# Patient Record
Sex: Male | Born: 1970 | Race: Black or African American | Hispanic: No | Marital: Single | Smoking: Never smoker
Health system: Southern US, Community
[De-identification: ages and names within clinical notes are randomized; demographics above are authoritative.]

## PROBLEM LIST (undated history)

## (undated) DIAGNOSIS — E079 Disorder of thyroid, unspecified: Secondary | ICD-10-CM

## (undated) DIAGNOSIS — K5792 Diverticulitis of intestine, part unspecified, without perforation or abscess without bleeding: Secondary | ICD-10-CM

---

## 1998-09-11 ENCOUNTER — Emergency Department (HOSPITAL_COMMUNITY): Admission: EM | Admit: 1998-09-11 | Discharge: 1998-09-11 | Payer: Self-pay | Admitting: Emergency Medicine

## 1998-09-12 ENCOUNTER — Encounter: Payer: Self-pay | Admitting: Emergency Medicine

## 2002-06-21 ENCOUNTER — Emergency Department (HOSPITAL_COMMUNITY): Admission: EM | Admit: 2002-06-21 | Discharge: 2002-06-21 | Payer: Self-pay | Admitting: Emergency Medicine

## 2008-10-07 ENCOUNTER — Emergency Department (HOSPITAL_BASED_OUTPATIENT_CLINIC_OR_DEPARTMENT_OTHER): Admission: EM | Admit: 2008-10-07 | Discharge: 2008-10-07 | Payer: Self-pay | Admitting: Emergency Medicine

## 2009-07-13 ENCOUNTER — Emergency Department (HOSPITAL_BASED_OUTPATIENT_CLINIC_OR_DEPARTMENT_OTHER): Admission: EM | Admit: 2009-07-13 | Discharge: 2009-07-13 | Payer: Self-pay | Admitting: Emergency Medicine

## 2009-07-13 ENCOUNTER — Ambulatory Visit: Payer: Self-pay | Admitting: Interventional Radiology

## 2010-12-13 ENCOUNTER — Emergency Department (INDEPENDENT_AMBULATORY_CARE_PROVIDER_SITE_OTHER): Payer: Self-pay

## 2010-12-13 ENCOUNTER — Emergency Department (HOSPITAL_BASED_OUTPATIENT_CLINIC_OR_DEPARTMENT_OTHER)
Admission: EM | Admit: 2010-12-13 | Discharge: 2010-12-13 | Disposition: A | Discharge status: Home / Self Care | Payer: Self-pay | Attending: Emergency Medicine | Admitting: Emergency Medicine

## 2010-12-13 DIAGNOSIS — R079 Chest pain, unspecified: Secondary | ICD-10-CM | POA: Insufficient documentation

## 2010-12-13 LAB — BASIC METABOLIC PANEL
BUN: 14 mg/dL (ref 6–23)
CO2: 24 mEq/L (ref 19–32)
Calcium: 9.7 mg/dL (ref 8.4–10.5)
Chloride: 106 mEq/L (ref 96–112)
Creatinine, Ser: 1.5 mg/dL (ref 0.4–1.5)
GFR calc Af Amer: >60 mL/min (ref >60)

## 2010-12-13 LAB — DIFFERENTIAL
Basophils Absolute: 0 10*3/uL (ref 0.0–0.1)
Basophils Relative: 0 % (ref 0–1)
Eosinophils Absolute: 0.1 10*3/uL (ref 0.0–0.7)
Eosinophils Relative: 1 % (ref 0–5)
Lymphocytes Relative: 48 % — ABNORMAL HIGH (ref 12–46)
Lymphs Abs: 4 10*3/uL (ref 0.7–4.0)
Monocytes Absolute: 0.7 10*3/uL (ref 0.1–1.0)
Monocytes Relative: 8 % (ref 3–12)
Neutro Abs: 3.7 10*3/uL (ref 1.7–7.7)
Neutrophils Relative %: 43 % (ref 43–77)

## 2010-12-13 LAB — CBC
MCH: 28.9 pg (ref 26.0–34.0)
MCHC: 34.6 g/dL (ref 30.0–36.0)
MCV: 83.4 fL (ref 78.0–100.0)
Platelets: 240 10*3/uL (ref 150–400)
RBC: 5.47 MIL/uL (ref 4.22–5.81)

## 2010-12-13 LAB — POCT CARDIAC MARKERS
CKMB, poc: <1 ng/mL — ABNORMAL LOW (ref 1.0–8.0)
Troponin i, poc: <0.05 ng/mL (ref 0.00–0.09)
Troponin i, poc: <0.05 ng/mL (ref 0.00–0.09)

## 2010-12-13 LAB — LIPASE, BLOOD: Lipase: 64 U/L (ref 23–300)

## 2010-12-20 ENCOUNTER — Ambulatory Visit (HOSPITAL_COMMUNITY): Payer: Self-pay

## 2011-01-14 LAB — BASIC METABOLIC PANEL
BUN: 15 mg/dL (ref 6–23)
Creatinine, Ser: 1.3 mg/dL (ref 0.4–1.5)
GFR calc Af Amer: >60 mL/min (ref >60)
GFR calc non Af Amer: >60 mL/min (ref >60)

## 2011-12-17 ENCOUNTER — Encounter (HOSPITAL_BASED_OUTPATIENT_CLINIC_OR_DEPARTMENT_OTHER): Payer: Self-pay | Admitting: Emergency Medicine

## 2011-12-17 ENCOUNTER — Emergency Department (HOSPITAL_BASED_OUTPATIENT_CLINIC_OR_DEPARTMENT_OTHER)
Admission: EM | Admit: 2011-12-17 | Discharge: 2011-12-17 | Disposition: A | Discharge status: Home / Self Care | Payer: Self-pay | Attending: Emergency Medicine | Admitting: Emergency Medicine

## 2011-12-17 DIAGNOSIS — Z79899 Other long term (current) drug therapy: Secondary | ICD-10-CM | POA: Insufficient documentation

## 2011-12-17 DIAGNOSIS — R109 Unspecified abdominal pain: Secondary | ICD-10-CM | POA: Insufficient documentation

## 2011-12-17 DIAGNOSIS — K5732 Diverticulitis of large intestine without perforation or abscess without bleeding: Secondary | ICD-10-CM | POA: Insufficient documentation

## 2011-12-17 DIAGNOSIS — K5792 Diverticulitis of intestine, part unspecified, without perforation or abscess without bleeding: Secondary | ICD-10-CM

## 2011-12-17 HISTORY — DX: Diverticulitis of intestine, part unspecified, without perforation or abscess without bleeding: K57.92

## 2011-12-17 MED ORDER — HYDROCODONE-ACETAMINOPHEN 5-325 MG PO TABS: 1.0000 | ORAL_TABLET | Freq: Four times a day (QID) | ORAL | Status: AC | PRN

## 2011-12-17 MED ORDER — METRONIDAZOLE 500 MG PO TABS: 500.0000 mg | ORAL_TABLET | Freq: Three times a day (TID) | ORAL | Status: AC

## 2011-12-17 MED ORDER — CIPROFLOXACIN HCL 500 MG PO TABS
500.0000 mg | ORAL_TABLET | Freq: Once | ORAL | Status: AC
  Administered 2011-12-17: 500 mg via ORAL
  Filled 2011-12-17: qty 1

## 2011-12-17 MED ORDER — CIPROFLOXACIN HCL 500 MG PO TABS: 500.0000 mg | ORAL_TABLET | Freq: Two times a day (BID) | ORAL | Status: AC

## 2011-12-17 MED ORDER — METRONIDAZOLE 500 MG PO TABS
500.0000 mg | ORAL_TABLET | Freq: Once | ORAL | Status: AC
  Administered 2011-12-17: 500 mg via ORAL
  Filled 2011-12-17: qty 1

## 2011-12-17 NOTE — ED Notes (Signed)
Pt c/o pain behind knees bilaterally x 1-2 weeks. Pt is concerned about DVT. Pt also states "diverticulosis is acting up". Pt c/o abd pain with diarrhea.

## 2011-12-17 NOTE — Discharge Instructions (Signed)
Diverticulitis A diverticulum is a small pouch or sac on the colon. Diverticulosis is the presence of these diverticula on the colon. Diverticulitis is the irritation (inflammation) or infection of diverticula. CAUSES  The colon and its diverticula contain bacteria. If food particles block the tiny opening to a diverticulum, the bacteria inside can grow and cause an increase in pressure. This leads to infection and inflammation and is called diverticulitis. SYMPTOMS   Abdominal pain and tenderness. Usually, the pain is located on the left side of your abdomen. However, it could be located elsewhere.   Fever.   Bloating.   Feeling sick to your stomach (nausea).   Throwing up (vomiting).   Abnormal stools.  DIAGNOSIS  Your caregiver will take a history and perform a physical exam. Since many things can cause abdominal pain, other tests may be necessary. Tests may include:  Blood tests.   Urine tests.   X-ray of the abdomen.   CT scan of the abdomen.  Sometimes, surgery is needed to determine if diverticulitis or other conditions are causing your symptoms. TREATMENT  Most of the time, you can be treated without surgery. Treatment includes:  Resting the bowels by only having liquids for a few days. As you improve, you will need to eat a low-fiber diet.   Intravenous (IV) fluids if you are losing body fluids (dehydrated).   Antibiotic medicines that treat infections may be given.   Pain and nausea medicine, if needed.   Surgery if the inflamed diverticulum has burst.  HOME CARE INSTRUCTIONS   Try a clear liquid diet (broth, tea, or water for as long as directed by your caregiver). You may then gradually begin a low-fiber diet as tolerated. A low-fiber diet is a diet with less than 10 grams of fiber. Choose the foods below to reduce fiber in the diet:   White breads, cereals, rice, and pasta.   Cooked fruits and vegetables or soft fresh fruits and vegetables without the skin.     Ground or well-cooked tender beef, ham, veal, lamb, pork, or poultry.   Eggs and seafood.   After your diverticulitis symptoms have improved, your caregiver may put you on a high-fiber diet. A high-fiber diet includes 14 grams of fiber for every 1000 calories consumed. For a standard 2000 calorie diet, you would need 28 grams of fiber. Follow these diet guidelines to help you increase the fiber in your diet. It is important to slowly increase the amount fiber in your diet to avoid gas, constipation, and bloating.   Choose whole-grain breads, cereals, pasta, and brown rice.   Choose fresh fruits and vegetables with the skin on. Do not overcook vegetables because the more vegetables are cooked, the more fiber is lost.   Choose more nuts, seeds, legumes, dried peas, beans, and lentils.   Look for food products that have greater than 3 grams of fiber per serving on the Nutrition Facts label.   Take all medicine as directed by your caregiver.   If your caregiver has given you a follow-up appointment, it is very important that you go. Not going could result in lasting (chronic) or permanent injury, pain, and disability. If there is any problem keeping the appointment, call to reschedule.  SEEK MEDICAL CARE IF:   Your pain does not improve.   You have a hard time advancing your diet beyond clear liquids.   Your bowel movements do not return to normal.  SEEK IMMEDIATE MEDICAL CARE IF:   Your pain becomes   worse.   You have an oral temperature above 102 F (38.9 C), not controlled by medicine.   You have repeated vomiting.   You have bloody or black, tarry stools.   Symptoms that brought you to your caregiver become worse or are not getting better.  MAKE SURE YOU:   Understand these instructions.   Will watch your condition.   Will get help right away if you are not doing well or get worse.  Document Released: 06/26/2005 Document Revised: 09/05/2011 Document Reviewed:  10/22/2010 [employer] Patient Information 2012 [employer]. 

## 2011-12-17 NOTE — ED Provider Notes (Addendum)
History     CSN: [id number]  Arrival date & time 12/17/11  0516   First MD Initiated Contact with Patient 12/17/11 0530      Chief Complaint  Patient presents with  . Abdominal Pain    (Consider location/radiation/quality/duration/timing/severity/associated sxs/prior treatment) HPI Is a 41 year old white male with a history of diverticulitis. He has had 3 days of left lower quadrant pain and tenderness consistent with his prior episodes of diverticulitis. The pain is mild to moderate, worse with movement or palpation. Has been associated with diarrhea but no hematochezia or melena. Has had no nausea, vomiting, fever chills. He states his diverticulitis his first diagnosed about 4 years ago and was confirmed with a CT scan.   He has also had intermittent pains in his eye lateral popliteal fossae and antecubital fossae for the past 3 weeks. These pains are well localized. There is no tenderness associated with these pains. There is no edema associated with these pains. He denies any injury. 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. He denies any recent surgery, immobilization, prolonged car or plane trip or smoking. He asked about the possibility of DVT.  Past Medical History  Diagnosis Date  . Diverticulitis     History reviewed. No pertinent past surgical history.  No family history on file.  History  Substance Use Topics  . Smoking status: Never Smoker   . Smokeless tobacco: Not on file  . Alcohol Use: No      Review of Systems  All other systems reviewed and are negative.    Allergies  Review of patient's allergies indicates no known allergies.  Home Medications   Current Outpatient Rx  Name Route Sig Dispense Refill  . LEVOTHYROXINE SODIUM 50 MCG PO TABS Oral Take 50 mcg by mouth daily.    Marland Kitchen LOVASTATIN 20 MG PO TABS Oral Take 20 mg by mouth at bedtime.    Marland Kitchen PANTOPRAZOLE SODIUM 40 MG PO TBEC Oral Take 40 mg by mouth daily.      BP 115/78  Pulse 88  Temp(Src) 98.5 F  (36.9 C) (Oral)  Resp 18  Ht 6\' 3"  (1.905 m)  Wt 235 lb (106.595 kg)  BMI 29.37 kg/m2  SpO2 100%  Physical Exam General: Well-developed, well-nourished male in no acute distress; appearance consistent with age of record HENT: normocephalic, atraumatic Eyes: pupils equal round and reactive to light; extraocular muscles intact Neck: supple Heart: regular rate and rhythm Lungs: clear to auscultation bilaterally Abdomen: soft; nondistended; mild to moderate left lower quadrant tenderness; no masses or hepatosplenomegaly; bowel sounds present Extremities: No deformity; full range of motion; pulses normal; no edema; no tenderness; no palpable cords; negative Homans sign bilaterally Neurologic: Awake, alert and oriented; motor function intact in all extremities and symmetric; no facial droop Skin: Warm and dry Psychiatric: Normal mood and affect    ED Course  Procedures (including critical care time)     MDM  1. The patient's Wells score for DVT is 0. He has no known risk factor. The pattern of his pain and lack of physical findings are not consistent with deep vein thrombosis. He was advised that should symptoms localize, particularly to one leg, he should be reevaluated.  It is noted that he is taking lovastatin and statins can cause muscle pain. He was advised to discuss his symptoms with his primary care physician.  2. the patient has been treated for diverticulitis multiple times. His symptoms are consistent with prior episodes. With the lack of peritoneal  signs or significant systemic disease we will treat presumptively with antibiotics. He was advised to return should his symptoms worsen instead of improve over the next several days a CT scan would be entertained.        Hanley Seamen, MD 12/17/11 1610  Hanley Seamen, MD 12/17/11 [phone]

## 2011-12-17 NOTE — ED Notes (Signed)
Pt states he was taking tramadol and amitripyline for diverticulitis previously

## 2014-01-03 ENCOUNTER — Encounter (HOSPITAL_BASED_OUTPATIENT_CLINIC_OR_DEPARTMENT_OTHER): Payer: Self-pay | Admitting: Emergency Medicine

## 2014-01-03 ENCOUNTER — Emergency Department (HOSPITAL_BASED_OUTPATIENT_CLINIC_OR_DEPARTMENT_OTHER)
Admission: EM | Admit: 2014-01-03 | Discharge: 2014-01-03 | Disposition: A | Discharge status: Home / Self Care | Payer: Self-pay | Attending: Emergency Medicine | Admitting: Emergency Medicine

## 2014-01-03 DIAGNOSIS — E079 Disorder of thyroid, unspecified: Secondary | ICD-10-CM | POA: Insufficient documentation

## 2014-01-03 DIAGNOSIS — M545 Low back pain, unspecified: Secondary | ICD-10-CM | POA: Insufficient documentation

## 2014-01-03 DIAGNOSIS — Z8719 Personal history of other diseases of the digestive system: Secondary | ICD-10-CM | POA: Insufficient documentation

## 2014-01-03 DIAGNOSIS — Z79899 Other long term (current) drug therapy: Secondary | ICD-10-CM | POA: Insufficient documentation

## 2014-01-03 HISTORY — DX: Disorder of thyroid, unspecified: E07.9

## 2014-01-03 LAB — URINALYSIS, ROUTINE W REFLEX MICROSCOPIC
BILIRUBIN URINE: NEGATIVE
GLUCOSE, UA: NEGATIVE mg/dL
HGB URINE DIPSTICK: NEGATIVE
Ketones, ur: NEGATIVE mg/dL
Leukocytes, UA: NEGATIVE
Nitrite: NEGATIVE
PH: 5 (ref 5.0–8.0)
Protein, ur: NEGATIVE mg/dL
SPECIFIC GRAVITY, URINE: 1.026 (ref 1.005–1.030)
Urobilinogen, UA: 0.2 mg/dL (ref 0.0–1.0)

## 2014-01-03 MED ORDER — CYCLOBENZAPRINE HCL 5 MG PO TABS: 5.0000 mg | ORAL_TABLET | Freq: Three times a day (TID) | ORAL | Status: DC | PRN

## 2014-01-03 MED ORDER — IBUPROFEN 800 MG PO TABS: 800.0000 mg | ORAL_TABLET | Freq: Three times a day (TID) | ORAL | Status: DC

## 2014-01-03 MED ORDER — OXYCODONE-ACETAMINOPHEN 5-325 MG PO TABS: 1.0000 | ORAL_TABLET | Freq: Four times a day (QID) | ORAL | Status: DC | PRN

## 2014-01-03 NOTE — Discharge Instructions (Signed)
Return to the ED with any concerns including weakness of legs, not able to urinate, loss of control of bowel or bladder, fever/chills, or any other alarming symptoms °

## 2014-01-03 NOTE — ED Notes (Signed)
Pt c/o left low back pain x 1 day, denies injury. Pain constant but worse with movement. Denies dysuria, denies hematuria. Pt drove self to ED. 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

## 2014-01-03 NOTE — ED Provider Notes (Signed)
CSN: [id number]     Arrival date & time 01/03/14  0730 History   First MD Initiated Contact with Patient 01/03/14 0820     Chief Complaint  Patient presents with  . Back Pain     (Consider location/radiation/quality/duration/timing/severity/associated sxs/prior Treatment) HPI Pt presenting with c/o low back pain.  He states mild pain began last night after bending over to pick something up.  This morning he feels that left lower back is tight and painful.  No weakness of legs.  No trauma or significant injury, no incontinence of bowel or bladder, no urinary retention.  No fever/chills.  Has tried hydrocodone with mild relief.  Pain is worse with movement and palpation. There are no other associated systemic symptoms, there are no other alleviating or modifying factors.   Past Medical History  Diagnosis Date  . Diverticulitis   . Thyroid disease    History reviewed. No pertinent past surgical history. No family history on file. History  Substance Use Topics  . Smoking status: Never Smoker   . Smokeless tobacco: Not on file  . Alcohol Use: No    Review of Systems ROS reviewed and all otherwise negative except for mentioned in HPI    Allergies  Review of patient's allergies indicates no known allergies.  Home Medications   Current Outpatient Rx  Name  Route  Sig  Dispense  Refill  . Ciprofloxacin (CIPRO PO)   Oral   Take by mouth.         Marland Kitchen. HYDROcodone-acetaminophen (NORCO/VICODIN) 5-325 MG per tablet   Oral   Take 1 tablet by mouth every 6 (six) hours as needed for moderate pain.         . cyclobenzaprine (FLEXERIL) 5 MG tablet   Oral   Take 1 tablet (5 mg total) by mouth 3 (three) times daily as needed for muscle spasms.   20 tablet   0   . ibuprofen (ADVIL,MOTRIN) 800 MG tablet   Oral   Take 1 tablet (800 mg total) by mouth 3 (three) times daily.   21 tablet   0   . levothyroxine (SYNTHROID, LEVOTHROID) 50 MCG tablet   Oral   Take 50 mcg by mouth  daily.         Marland Kitchen. lovastatin (MEVACOR) 20 MG tablet   Oral   Take 20 mg by mouth at bedtime.         Marland Kitchen. oxyCODONE-acetaminophen (PERCOCET/ROXICET) 5-325 MG per tablet   Oral   Take 1-2 tablets by mouth every 6 (six) hours as needed for severe pain.   15 tablet   0   . pantoprazole (PROTONIX) 40 MG tablet   Oral   Take 40 mg by mouth daily.          BP 147/84  Pulse 113  Temp(Src) 98.3 F (36.8 C)  Resp 18  Ht 6\' 3"  (1.905 m)  Wt 228 lb (103.42 kg)  BMI 28.50 kg/m2  SpO2 98% Vitals reviewed Physical Exam Physical Examination: General appearance - alert, well appearing, and in no distress Mental status - alert, oriented to person, place, and time Eyes - no conjunctival injection, no scleral icterus Mouth - mucous membranes moist, pharynx normal without lesions Chest - clear to auscultation, no wheezes, rales or rhonchi, symmetric air entry Heart - normal rate, regular rhythm, normal S1, S2, no murmurs, rubs, clicks or gallops Abdomen - soft, nontender, nondistended, no masses or organomegaly Back exam - full range of motion, no midline tenderness, ttp  over left lower lumbar paraspinal tenderness Neurological - alert, oriented, normal speech, strength 5/5 in extremities x 4, sensation intact Extremities - peripheral pulses normal, no pedal edema, no clubbing or cyanosis Skin - normal coloration and turgor, no rashes  ED Course  Procedures (including critical care time) Labs Review Labs Reviewed  URINALYSIS, ROUTINE W REFLEX MICROSCOPIC   Imaging Review No results found.   EKG Interpretation None      MDM   Final diagnoses:  Low back pain    Pt presenting with c/o left low back pain.  No midline tenderness.  No signs or symptoms of cauda equina.  Pt treated for muscle spasm.  Discharged with strict return precautions.  Pt agreeable with plan. I doubt any other EMC precluding discharge at this time including, but not necessarily limited to the following:  cauda equina, acute cord compression or significant nerve root compression, ruptured disk, AAA, fracture, tumor, or infection.      Ethelda Chick, MD 01/03/14 1259

## 2019-10-21 ENCOUNTER — Emergency Department (HOSPITAL_BASED_OUTPATIENT_CLINIC_OR_DEPARTMENT_OTHER): Payer: BC Managed Care – PPO

## 2019-10-21 ENCOUNTER — Emergency Department (HOSPITAL_BASED_OUTPATIENT_CLINIC_OR_DEPARTMENT_OTHER)
Admission: EM | Admit: 2019-10-21 | Discharge: 2019-10-21 | Disposition: A | Discharge status: Home / Self Care | Payer: BC Managed Care – PPO | Attending: Emergency Medicine | Admitting: Emergency Medicine

## 2019-10-21 ENCOUNTER — Encounter (HOSPITAL_BASED_OUTPATIENT_CLINIC_OR_DEPARTMENT_OTHER): Payer: Self-pay | Admitting: Emergency Medicine

## 2019-10-21 ENCOUNTER — Other Ambulatory Visit: Payer: Self-pay

## 2019-10-21 DIAGNOSIS — R079 Chest pain, unspecified: Secondary | ICD-10-CM | POA: Diagnosis present

## 2019-10-21 DIAGNOSIS — M79602 Pain in left arm: Secondary | ICD-10-CM | POA: Insufficient documentation

## 2019-10-21 LAB — COMPREHENSIVE METABOLIC PANEL
ALT: 20 U/L (ref 0–44)
AST: 19 U/L (ref 15–41)
Albumin: 4.3 g/dL (ref 3.5–5.0)
Alkaline Phosphatase: 70 U/L (ref 38–126)
Anion gap: 7 (ref 5–15)
BUN: 15 mg/dL (ref 6–20)
CO2: 24 mmol/L (ref 22–32)
Calcium: 9.3 mg/dL (ref 8.9–10.3)
Chloride: 109 mmol/L (ref 98–111)
Creatinine, Ser: 1.35 mg/dL — ABNORMAL HIGH (ref 0.61–1.24)
GFR calc Af Amer: >60 mL/min (ref >60)
GFR calc non Af Amer: >60 mL/min (ref >60)
Glucose, Bld: 161 mg/dL — ABNORMAL HIGH (ref 70–99)
Potassium: 3.7 mmol/L (ref 3.5–5.1)
Sodium: 140 mmol/L (ref 135–145)
Total Bilirubin: 0.5 mg/dL (ref 0.3–1.2)
Total Protein: 7.3 g/dL (ref 6.5–8.1)

## 2019-10-21 LAB — TROPONIN I (HIGH SENSITIVITY): Troponin I (High Sensitivity): <2 ng/L (ref <18)

## 2019-10-21 LAB — CBC WITH DIFFERENTIAL/PLATELET
Abs Immature Granulocytes: 0.03 10*3/uL (ref 0.00–0.07)
Basophils Absolute: 0 10*3/uL (ref 0.0–0.1)
Basophils Relative: 0 %
Eosinophils Absolute: 0.1 10*3/uL (ref 0.0–0.5)
Eosinophils Relative: 1 %
HCT: 46 % (ref 39.0–52.0)
Hemoglobin: 14.7 g/dL (ref 13.0–17.0)
Immature Granulocytes: 0 %
Lymphocytes Relative: 44 %
Lymphs Abs: 5 10*3/uL — ABNORMAL HIGH (ref 0.7–4.0)
MCH: 28.5 pg (ref 26.0–34.0)
MCHC: 32 g/dL (ref 30.0–36.0)
MCV: 89.3 fL (ref 80.0–100.0)
Monocytes Absolute: 0.9 10*3/uL (ref 0.1–1.0)
Monocytes Relative: 8 %
Neutro Abs: 5.3 10*3/uL (ref 1.7–7.7)
Neutrophils Relative %: 47 %
Platelets: 253 10*3/uL (ref 150–400)
RBC: 5.15 MIL/uL (ref 4.22–5.81)
RDW: 14.3 % (ref 11.5–15.5)
WBC: 11.3 10*3/uL — ABNORMAL HIGH (ref 4.0–10.5)
nRBC: 0 % (ref 0.0–0.2)

## 2019-10-21 MED ORDER — PREDNISONE 10 MG PO TABS: 20.0000 mg | ORAL_TABLET | Freq: Two times a day (BID) | ORAL | 0 refills | Status: DC

## 2019-10-21 NOTE — ED Notes (Signed)
ED Provider at bedside. 

## 2019-10-21 NOTE — ED Notes (Signed)
Lab informed of orders in from MD.

## 2019-10-21 NOTE — ED Triage Notes (Signed)
Pt c/o pain in left upper chest and left arm x 3 days. Pt states he get slightly shob when going up his stairs.

## 2019-10-21 NOTE — ED Provider Notes (Signed)
[facility] Provider Note   CSN: [id number] Arrival date & time: 10/21/19  0241     History Chief Complaint  Patient presents with  . Arm Pain    Andrew Terrell is a 49 y.o. male.  Patient is a 49 year old male with past medical history of hypertension and hyperlipidemia.  She presents today for evaluation of left arm pain and numbness.  He describes the pain starting from his shoulder and radiating to his left wrist and the hand.  This began 2 days ago in the absence of any injury or trauma.  He denies any shortness of breath, chest pain, nausea, or diaphoresis.  He denies any exertional chest pain, but does report feeling short of breath with climbing steps recently.  He has no prior cardiac history, but does report a normal stress test and echocardiogram approximately 5 years ago.  The history is provided by the patient.  Arm Pain This is a new problem. The current episode started 2 days ago. The problem occurs constantly. The problem has not changed since onset.Pertinent negatives include no chest pain and no shortness of breath. Nothing aggravates the symptoms. Nothing relieves the symptoms.       Past Medical History:  Diagnosis Date  . Diverticulitis   . Thyroid disease     There are no problems to display for this patient.   History reviewed. No pertinent surgical history.     No family history on file.  Social History   Tobacco Use  . Smoking status: Never Smoker  Substance Use Topics  . Alcohol use: No  . Drug use: No    Home Medications Prior to Admission medications   Medication Sig Start Date End Date Taking? Authorizing Provider  levothyroxine (SYNTHROID, LEVOTHROID) 50 MCG tablet Take 50 mcg by mouth daily.   Yes [provider]  lovastatin (MEVACOR) 20 MG tablet Take 20 mg by mouth at bedtime.   Yes [provider]    Allergies    Patient has no known allergies.  Review of Systems   Review  of Systems  Respiratory: Negative for shortness of breath.   Cardiovascular: Negative for chest pain.  All other systems reviewed and are negative.   Physical Exam Updated Vital Signs BP (!) 146/91   Pulse 89   Temp 98 F (36.7 C) (Oral)   Resp 16   Ht 6\' 3"  (1.905 m)   Wt 99.8 kg   SpO2 98%   BMI 27.50 kg/m   Physical Exam Vitals and nursing note reviewed.  Constitutional:      General: He is not in acute distress.    Appearance: He is well-developed. He is not diaphoretic.  HENT:     Head: Normocephalic and atraumatic.  Cardiovascular:     Rate and Rhythm: Normal rate and regular rhythm.     Heart sounds: No murmur. No friction rub.  Pulmonary:     Effort: Pulmonary effort is normal. No respiratory distress.     Breath sounds: Normal breath sounds. No wheezing or rales.  Abdominal:     General: Bowel sounds are normal. There is no distension.     Palpations: Abdomen is soft.     Tenderness: There is no abdominal tenderness.  Musculoskeletal:        General: Normal range of motion.     Cervical back: Normal range of motion and neck supple.  Skin:    General: Skin is warm and dry.  Neurological:  General: No focal deficit present.     Mental Status: He is alert and oriented to person, place, and time.     Cranial Nerves: No cranial nerve deficit.     Coordination: Coordination normal.     Comments: The left arm is grossly normal in appearance.  There is no swelling or other abnormality.  Ulnar and radial pulses are easily palpable and he is able to flex, extend, and oppose all fingers.  Sensation is intact throughout the entire hand.     ED Results / Procedures / Treatments   Labs (all labs ordered are listed, but only abnormal results are displayed) Labs Reviewed  COMPREHENSIVE METABOLIC PANEL  CBC WITH DIFFERENTIAL/PLATELET  TROPONIN I (HIGH SENSITIVITY)    EKG EKG Interpretation  Date/Time:  Thursday October 21 2019 02:53:19 EST Ventricular Rate:    89 PR Interval:    QRS Duration: 90 QT Interval:  337 QTC Calculation: 410 R Axis:   9 Text Interpretation: Sinus rhythm Nonspecific T wave abnormality No significant change since 12/13/2010 Confirmed by Geoffery Lyons ([id number]) on 10/21/2019 2:58:17 AM   Radiology No results found.  Procedures Procedures (including critical care time)  Medications Ordered in ED Medications - No data to display  ED Course  I have reviewed the triage vital signs and the nursing notes.  Pertinent labs & imaging results that were available during my care of the patient were reviewed by me and considered in my medical decision making (see chart for details).    MDM Rules/Calculators/A&P  Patient presenting here with complaints of numbness and discomfort to his left arm.  This has been ongoing for the past 2 days and worse when he moves the arm in certain directions.  I suspect a musculoskeletal cause.  Patient will be treated with prednisone and rest.  Nothing in the work-up suggest a cardiac etiology.  His symptoms are atypical and troponin is negative after 2 days of symptoms.  His EKG is identical to prior study from 2012.  Final Clinical Impression(s) / ED Diagnoses Final diagnoses:  None    Rx / DC Orders ED Discharge Orders    None       Geoffery Lyons, MD 10/21/19 [phone]

## 2019-10-21 NOTE — Discharge Instructions (Signed)
Begin taking prednisone as prescribed.  Rest.  Follow-up with your primary doctor if symptoms or not improving in the next few days, and return to the ER if you develop severe chest pain, difficulty breathing, or other new and concerning symptoms.

## 2019-10-25 ENCOUNTER — Emergency Department (HOSPITAL_BASED_OUTPATIENT_CLINIC_OR_DEPARTMENT_OTHER)
Admission: EM | Admit: 2019-10-25 | Discharge: 2019-10-25 | Disposition: A | Discharge status: Home / Self Care | Payer: BC Managed Care – PPO | Attending: Emergency Medicine | Admitting: Emergency Medicine

## 2019-10-25 ENCOUNTER — Encounter (HOSPITAL_BASED_OUTPATIENT_CLINIC_OR_DEPARTMENT_OTHER): Payer: Self-pay | Admitting: *Deleted

## 2019-10-25 ENCOUNTER — Other Ambulatory Visit: Payer: Self-pay

## 2019-10-25 DIAGNOSIS — G43909 Migraine, unspecified, not intractable, without status migrainosus: Secondary | ICD-10-CM | POA: Diagnosis not present

## 2019-10-25 DIAGNOSIS — Z79899 Other long term (current) drug therapy: Secondary | ICD-10-CM | POA: Diagnosis not present

## 2019-10-25 DIAGNOSIS — R519 Headache, unspecified: Secondary | ICD-10-CM | POA: Diagnosis present

## 2019-10-25 DIAGNOSIS — E079 Disorder of thyroid, unspecified: Secondary | ICD-10-CM | POA: Insufficient documentation

## 2019-10-25 DIAGNOSIS — J0110 Acute frontal sinusitis, unspecified: Secondary | ICD-10-CM | POA: Insufficient documentation

## 2019-10-25 MED ORDER — KETOROLAC TROMETHAMINE 30 MG/ML IJ SOLN
30.0000 mg | Freq: Once | INTRAMUSCULAR | Status: AC
  Administered 2019-10-25: 30 mg via INTRAMUSCULAR
  Filled 2019-10-25: qty 1

## 2019-10-25 MED ORDER — AMOXICILLIN-POT CLAVULANATE 875-125 MG PO TABS: 1.0000 | ORAL_TABLET | Freq: Two times a day (BID) | ORAL | 0 refills | Status: AC

## 2019-10-25 NOTE — ED Provider Notes (Signed)
[facility] EMERGENCY DEPARTMENT Provider Note   CSN: [id number] Arrival date & time: 10/25/19  1658     History Chief Complaint  Patient presents with  . Headache    Andrew Terrell is a 49 y.o. male with a past medical history significant for hypertension, hyperlipidemia, hx diverticulitis, and thyroid disease who presents to the ED due to gradual onset of consistent frontal headache x 2.5 days. Patient states he felt like it started as a "sinus headache" that has progressively gotten worsen. Patient describes the pain as pressure-like and rates it an 8/10. Pain is worse when leaning forward. He has tried Afrin and Tylenol with no relief. Frontal headache is associated with photophobia. Patient admits to history of migraines in the past, but hasn't had any in the past 10 years. Patient denies changes to vision, nausea, fever, chills and vomiting. Denies numbness/tingling and changes to his speech. Denies head injury.  Patient denies other URI symptoms.  He is tested for Covid every 2 weeks for work with the last one being negative.  Patient denies known Covid exposures and sick contacts.    Past Medical History:  Diagnosis Date  . Diverticulitis   . Thyroid disease     There are no problems to display for this patient.   History reviewed. No pertinent surgical history.     No family history on file.  Social History   Tobacco Use  . Smoking status: Never Smoker  . Smokeless tobacco: Never Used  Substance Use Topics  . Alcohol use: Not Currently  . Drug use: No    Home Medications Prior to Admission medications   Medication Sig Start Date End Date Taking? Authorizing Provider  amoxicillin-clavulanate (AUGMENTIN) 875-125 MG tablet Take 1 tablet by mouth 2 (two) times daily for 7 days. 10/25/19 11/01/19  Mannie Stabile, PA-C  levothyroxine (SYNTHROID, LEVOTHROID) 50 MCG tablet Take 50 mcg by mouth daily.    [provider]  lovastatin (MEVACOR) 20 MG  tablet Take 20 mg by mouth at bedtime.    [provider]  predniSONE (DELTASONE) 10 MG tablet Take 2 tablets (20 mg total) by mouth 2 (two) times daily. 10/21/19   Geoffery Lyons, MD    Allergies    Patient has no known allergies.  Review of Systems   Review of Systems  Constitutional: Negative for chills and fever.  Respiratory: Negative for shortness of breath.   Cardiovascular: Negative for chest pain.  Gastrointestinal: Negative for abdominal pain, diarrhea, nausea and vomiting.  Neurological: Positive for headaches. Negative for dizziness, weakness and numbness.    Physical Exam Updated Vital Signs BP (!) 129/91   Pulse 79   Temp 98.4 F (36.9 C) (Oral)   Resp 16   Ht 6\' 3"  (1.905 m)   Wt 99.8 kg   SpO2 98%   BMI 27.50 kg/m   Physical Exam Vitals and nursing note reviewed.  Constitutional:      General: He is not in acute distress.    Appearance: He is not ill-appearing.  HENT:     Head: Normocephalic.     Comments: Tenderness to palpation over frontal sinus    Right Ear: Tympanic membrane normal.     Left Ear: Tympanic membrane normal.     Nose: Nose normal.  Eyes:     Extraocular Movements: Extraocular movements intact.     Pupils: Pupils are equal, round, and reactive to light.  Cardiovascular:     Rate and Rhythm: Normal rate  and regular rhythm.     Pulses: Normal pulses.     Heart sounds: Normal heart sounds. No murmur. No friction rub. No gallop.   Pulmonary:     Effort: Pulmonary effort is normal.     Breath sounds: Normal breath sounds.  Abdominal:     General: Abdomen is flat. There is no distension.     Palpations: Abdomen is soft.     Tenderness: There is no abdominal tenderness. There is no guarding or rebound.  Musculoskeletal:     Cervical back: Neck supple.     Comments: Able to move all 4 extremities without difficulty.   Skin:    General: Skin is warm and dry.  Neurological:     General: No focal deficit present.     Mental  Status: He is alert.     Comments: Speech is clear, able to follow commands CN III-XII intact Normal strength in upper and lower extremities bilaterally including dorsiflexion and plantar flexion, strong and equal grip strength Sensation grossly intact throughout Moves extremities without ataxia, coordination intact No pronator drift Ambulates without difficulty     ED Results / Procedures / Treatments   Labs (all labs ordered are listed, but only abnormal results are displayed) Labs Reviewed - No data to display  EKG None  Radiology No results found.  Procedures Procedures (including critical care time)  Medications Ordered in ED Medications  ketorolac (TORADOL) 30 MG/ML injection 30 mg (30 mg Intramuscular Given 10/25/19 1729)    ED Course  I have reviewed the triage vital signs and the nursing notes.  Pertinent labs & imaging results that were available during my care of the patient were reviewed by me and considered in my medical decision making (see chart for details).  Clinical Course as of Oct 24 1816  Mon Oct 25, 2019  1716 Reassessed patient at bedside and he states his headache has improved some after the Toradol.   [CA]    Clinical Course User Index [CA] Suzy Bouchard, PA-C   MDM Rules/Calculators/A&P                     49 year old male presents to the ED due to a frontal headache x2.5 days.  Patient denies sudden onset and sudden intensity of headache at onset.  Patient has a history of migraines but has not had a migraine in the past 10 years.  Stable vitals.  Patient in no acute distress and nonill appearing.  Normal neurological exam.  Tenderness palpation over the frontal sinus.  Suspect headache is related to sinusitis.  Headache treated and improved while in the ED with Toradol. Presentation non concerning for [facility], ICH, Meningitis, or temporal arteritis. Pt is afebrile with no focal neuro deficits, nuchal rigidity, or change in vision. Will  discharge patient with Augmentin. Patient advised he can take over the counter ibuprofen as needed for pain. Strict ED precautions discussed with patient. Patient states understanding and agrees to plan. Patient discharged home in no acute distress and stable vitals  Final Clinical Impression(s) / ED Diagnoses Final diagnoses:  Acute nonintractable headache, unspecified headache type  Acute non-recurrent frontal sinusitis    Rx / DC Orders ED Discharge Orders         Ordered    amoxicillin-clavulanate (AUGMENTIN) 875-125 MG tablet  2 times daily     10/25/19 1755           Karie Kirks 10/25/19 1820    Trifan,  Kermit Balo, MD 10/26/19 1134

## 2019-10-25 NOTE — ED Triage Notes (Signed)
Headache

## 2019-10-25 NOTE — Discharge Instructions (Signed)
As discussed, your headache is most likely related to a sinus infection.  I am sending you home with a prescription for an antibiotic.  Take twice a day for 7 days.  Finish all antibiotics.  You may also take over-the-counter ibuprofen as needed for pain.  If your symptoms do not improve within the next week follow-up with your PCP.  Return to the ER for new or worsening symptoms.

## 2021-07-20 IMAGING — CR DG CHEST 2V
2 series · 2 of 2 positions shown · non-contrast
Comparison: 12/13/2010

CLINICAL DATA: Chest pain. Left-sided chest and arm pain for 3
days.

EXAM:
CHEST - 2 VIEW

[w chest pa]
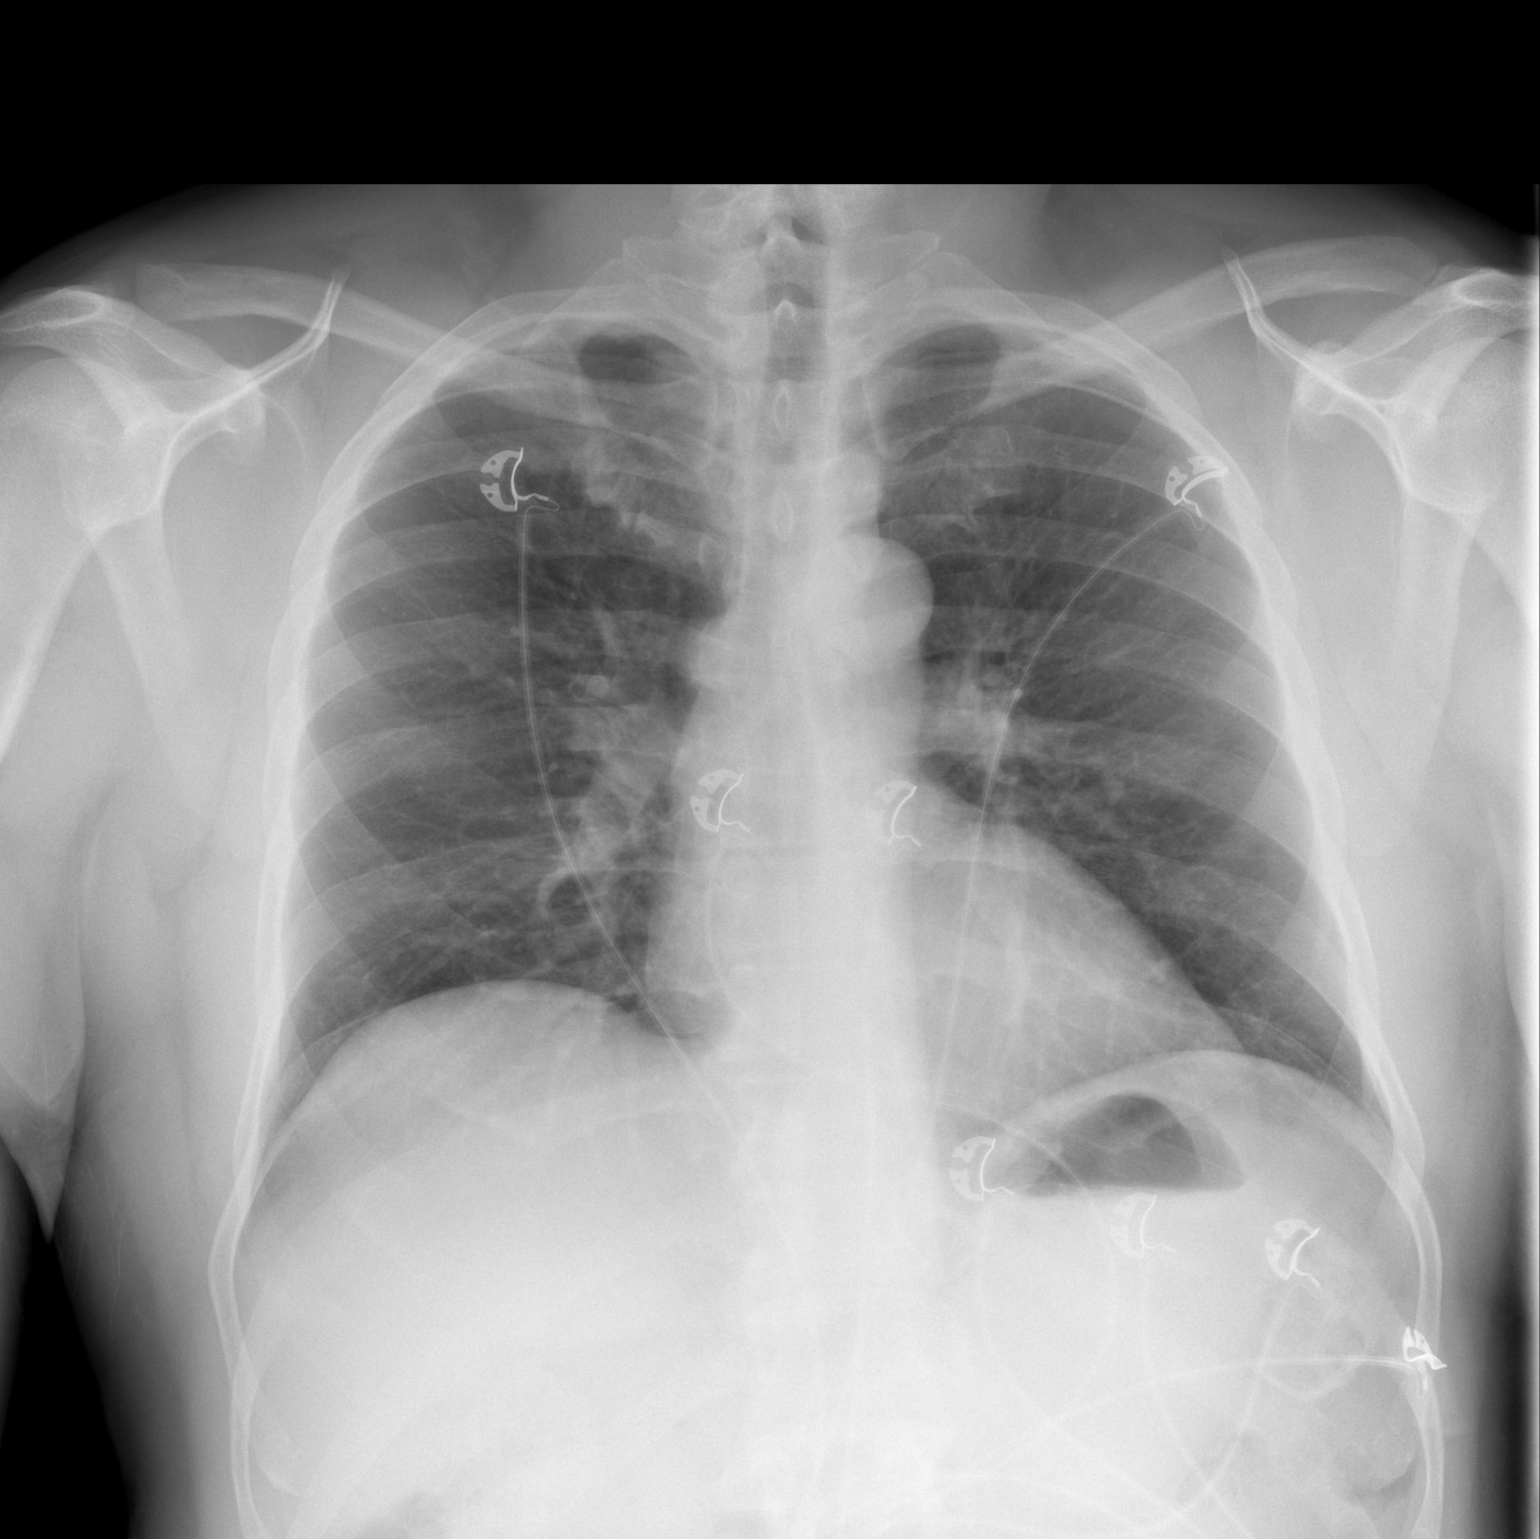

[w chest lat]
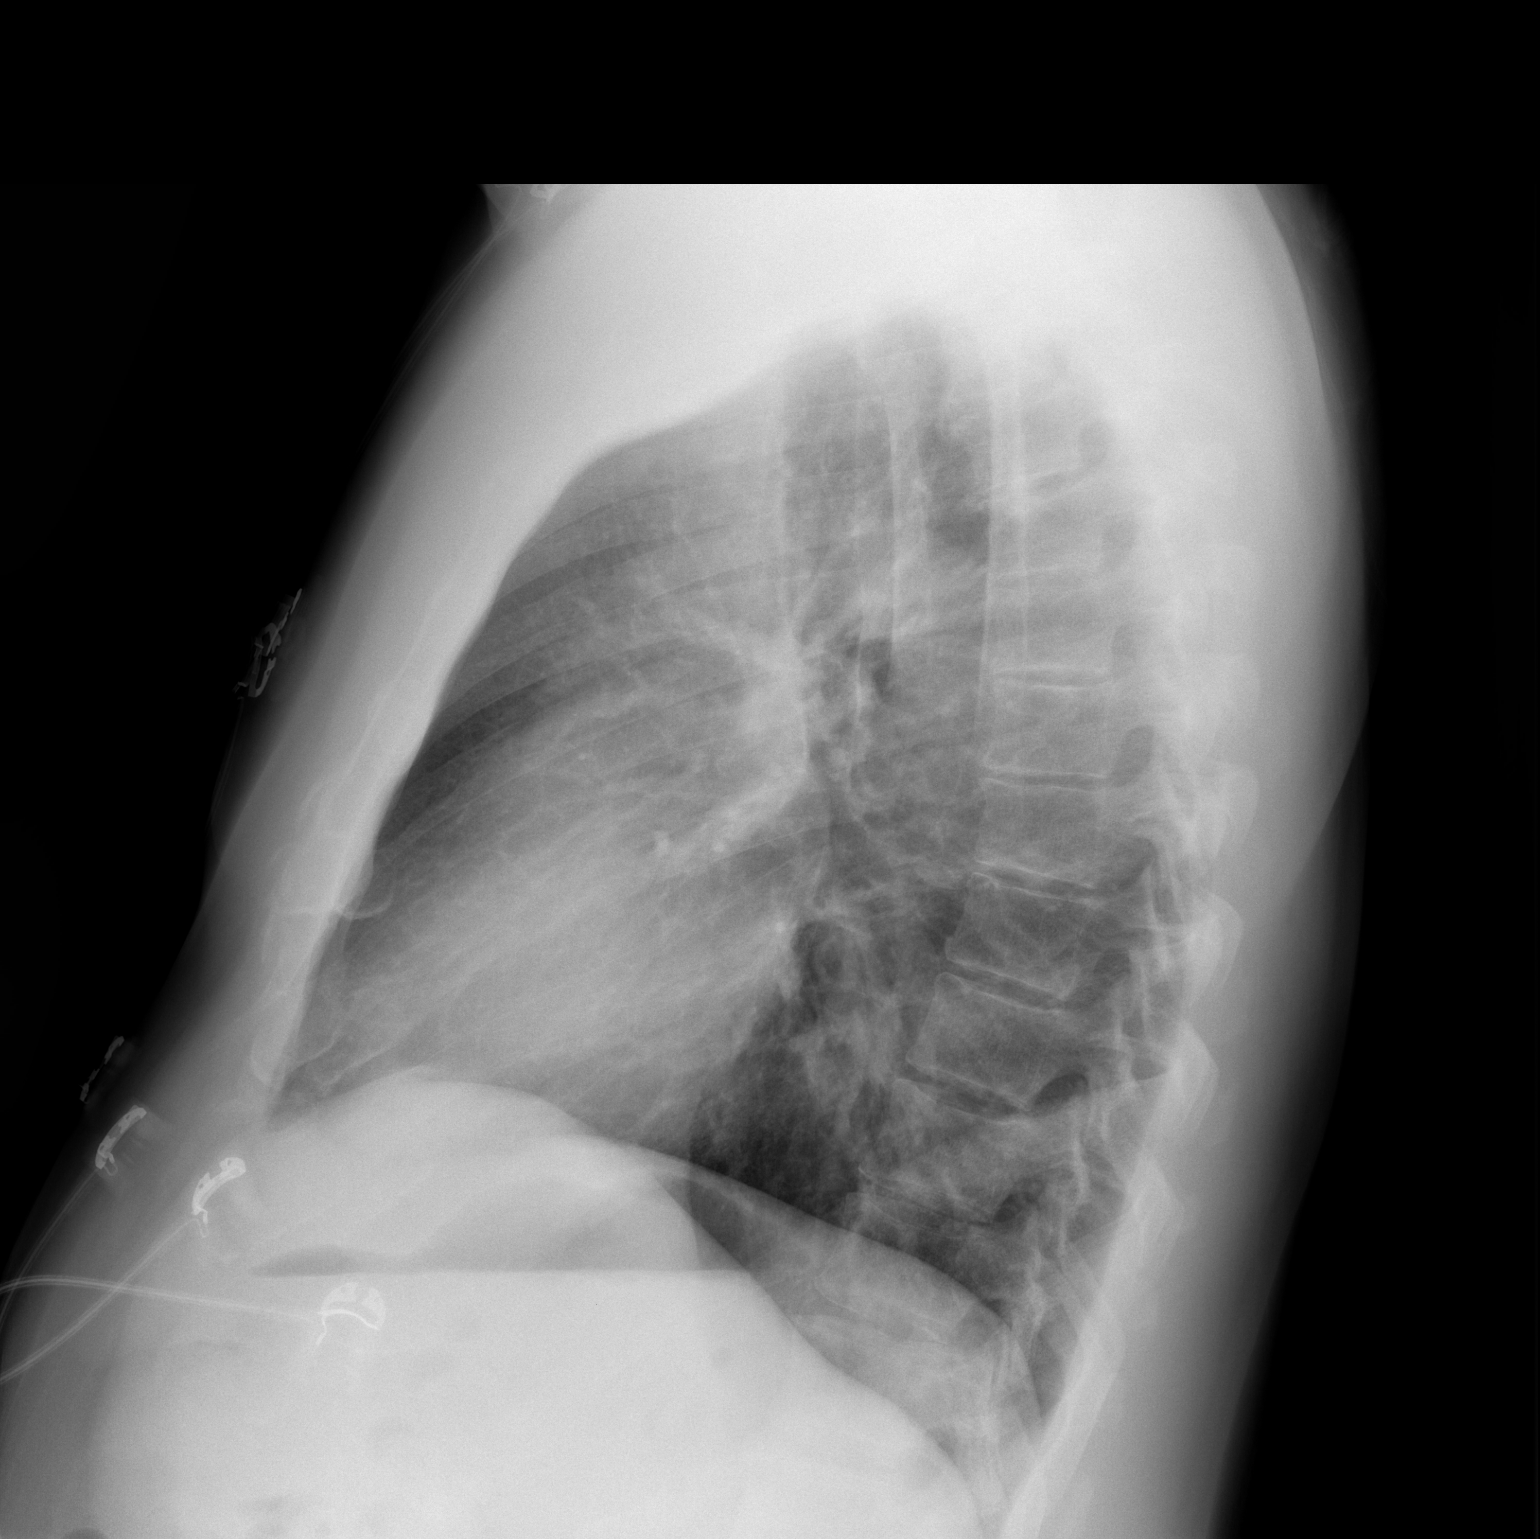

[2 of 2 positions shown; findings below may reference images not displayed]

FINDINGS: The cardiomediastinal contours are normal. The lungs are clear.
Pulmonary vasculature is normal. No consolidation, pleural effusion,
or pneumothorax. No acute osseous abnormalities are seen.
IMPRESSION: No acute chest findings.

## 2023-12-15 ENCOUNTER — Ambulatory Visit (INDEPENDENT_AMBULATORY_CARE_PROVIDER_SITE_OTHER)

## 2023-12-15 ENCOUNTER — Ambulatory Visit
Admission: EM | Admit: 2023-12-15 | Discharge: 2023-12-15 | Disposition: A | Discharge status: Home / Self Care | Attending: Family Medicine | Admitting: Family Medicine

## 2023-12-15 DIAGNOSIS — J22 Unspecified acute lower respiratory infection: Secondary | ICD-10-CM

## 2023-12-15 DIAGNOSIS — R051 Acute cough: Secondary | ICD-10-CM

## 2023-12-15 MED ORDER — AZITHROMYCIN 250 MG PO TABS: 250.0000 mg | ORAL_TABLET | Freq: Every day | ORAL | 0 refills | Status: DC

## 2023-12-15 MED ORDER — PROMETHAZINE-DM 6.25-15 MG/5ML PO SYRP: 5.0000 mL | ORAL_SOLUTION | Freq: Four times a day (QID) | ORAL | 0 refills | Status: AC | PRN

## 2023-12-15 NOTE — Discharge Instructions (Signed)
 Start azithromycin as prescribed. Promethazine DM as needed for cough. Please note this medication can make you drowsy. Do not drink alcohol or drive while on this medications. Lots of rest and fluids. Follow up with your PCP if your symptoms do not improve. Please go to the ER for any worsening symptoms. I hope you feel better soon!

## 2023-12-15 NOTE — ED Provider Notes (Signed)
 [facility]    CSN: [id number] Arrival date & time: 12/15/23  1109      History   Chief Complaint Chief Complaint  Patient presents with   Cough    HPI Andrew Terrell is a 53 y.o. male  presents for evaluation of URI symptoms for 7 days. Patient reports associated symptoms of cough, congestion, and difficulty taking a deep breath. Denies N/V/D, fevers, ear pain, sore throat, body aches, shortness of breath. Patient does not have a hx of asthma. Patient is not an active smoker.   Reports sick text via work.  Pt has taken her flu and Mucinex OTC for symptoms. Pt has no other concerns at this time.    Cough   Past Medical History:  Diagnosis Date   Diverticulitis    Thyroid disease     There are no active problems to display for this patient.   History reviewed. No pertinent surgical history.     Home Medications    Prior to Admission medications   Medication Sig Start Date End Date Taking? Authorizing Provider  azithromycin (ZITHROMAX) 250 MG tablet Take 1 tablet (250 mg total) by mouth daily. Take first 2 tablets together, then 1 every day until finished. 12/15/23  Yes Radford Pax, NP  promethazine-dextromethorphan (PROMETHAZINE-DM) 6.25-15 MG/5ML syrup Take 5 mLs by mouth 4 (four) times daily as needed for cough. 12/15/23  Yes Radford Pax, NP  levothyroxine (SYNTHROID, LEVOTHROID) 50 MCG tablet Take 50 mcg by mouth daily.    [provider]  lovastatin (MEVACOR) 20 MG tablet Take 20 mg by mouth at bedtime.    [provider]  predniSONE (DELTASONE) 10 MG tablet Take 2 tablets (20 mg total) by mouth 2 (two) times daily. 10/21/19   Geoffery Lyons, MD    Family History History reviewed. No pertinent family history.  Social History Social History   Tobacco Use   Smoking status: Never   Smokeless tobacco: Never  Substance Use Topics   Alcohol use: Not Currently   Drug use: No     Allergies   Patient has no known  allergies.   Review of Systems Review of Systems  HENT:  Positive for congestion.   Respiratory:  Positive for cough.      Physical Exam Triage Vital Signs ED Triage Vitals [12/15/23 1119]  Encounter Vitals Group     BP (!) 148/82     Systolic BP Percentile      Diastolic BP Percentile      Pulse Rate 85     Resp 18     Temp 97.9 F (36.6 C)     Temp Source Oral     SpO2 96 %     Weight      Height      Head Circumference      Peak Flow      Pain Score 0     Pain Loc      Pain Education      Exclude from Growth Chart    No data found.  Updated Vital Signs BP (!) 148/82 (BP Location: Right Arm)   Pulse 85   Temp 97.9 F (36.6 C) (Oral)   Resp 18   SpO2 96%   Visual Acuity Right Eye Distance:   Left Eye Distance:   Bilateral Distance:    Right Eye Near:   Left Eye Near:    Bilateral Near:     Physical Exam Vitals and nursing note reviewed.  Constitutional:      General: He is not in acute distress.    Appearance: Normal appearance. He is not ill-appearing or toxic-appearing.  HENT:     Head: Normocephalic and atraumatic.     Right Ear: Tympanic membrane and ear canal normal.     Left Ear: Tympanic membrane and ear canal normal.     Nose: Congestion present.     Mouth/Throat:     Mouth: Mucous membranes are moist.     Pharynx: No posterior oropharyngeal erythema.  Eyes:     Pupils: Pupils are equal, round, and reactive to light.  Cardiovascular:     Rate and Rhythm: Normal rate and regular rhythm.     Heart sounds: Normal heart sounds.  Pulmonary:     Effort: Pulmonary effort is normal.     Breath sounds: Normal breath sounds. No wheezing or rhonchi.  Musculoskeletal:     Cervical back: Normal range of motion and neck supple.  Lymphadenopathy:     Cervical: No cervical adenopathy.  Skin:    General: Skin is warm and dry.  Neurological:     General: No focal deficit present.     Mental Status: He is alert and oriented to person, place, and  time.  Psychiatric:        Mood and Affect: Mood normal.        Behavior: Behavior normal.      UC Treatments / Results  Labs (all labs ordered are listed, but only abnormal results are displayed) Labs Reviewed - No data to display  EKG   Radiology No results found.  Procedures Procedures (including critical care time)  Medications Ordered in UC Medications - No data to display  Initial Impression / Assessment and Plan / UC Course  I have reviewed the triage vital signs and the nursing notes.  Pertinent labs & imaging results that were available during my care of the patient were reviewed by me and considered in my medical decision making (see chart for details).     Reviewed exam and symptoms with patient.  No red flags.  Wet read of chest x-ray without obvious consolidation, will contact for any positive results based on radiology overread.  Will start Zithromax given length of symptoms.  Promethazine DM as needed for cough, side effect profile reviewed.  Advise rest fluids and PCP follow-up if symptoms do not improve.  ER precautions reviewed. Final Clinical Impressions(s) / UC Diagnoses   Final diagnoses:  Acute cough  Lower respiratory infection (e.g., bronchitis, pneumonia, pneumonitis, pulmonitis)     Discharge Instructions      Start azithromycin as prescribed. Promethazine DM as needed for cough. Please note this medication can make you drowsy. Do not drink alcohol or drive while on this medications. Lots of rest and fluids. Follow up with your PCP if your symptoms do not improve. Please go to the ER for any worsening symptoms. I hope you feel better soon!     ED Prescriptions     Medication Sig Dispense Auth. Provider   promethazine-dextromethorphan (PROMETHAZINE-DM) 6.25-15 MG/5ML syrup Take 5 mLs by mouth 4 (four) times daily as needed for cough. 118 mL Radford Pax, NP   azithromycin (ZITHROMAX) 250 MG tablet Take 1 tablet (250 mg total) by mouth  daily. Take first 2 tablets together, then 1 every day until finished. 6 tablet Radford Pax, NP      PDMP not reviewed this encounter.   Radford Pax, NP 12/15/23 [phone]

## 2023-12-15 NOTE — ED Triage Notes (Signed)
 Patient presents to UC for cough and chest congestion since 1 week. Coughing spells at night. Treating with mucinex with some relief.   Denies fever.

## 2023-12-30 ENCOUNTER — Ambulatory Visit
Admission: EM | Admit: 2023-12-30 | Discharge: 2023-12-30 | Disposition: A | Discharge status: Home / Self Care | Attending: Family Medicine | Admitting: Family Medicine

## 2023-12-30 ENCOUNTER — Ambulatory Visit (INDEPENDENT_AMBULATORY_CARE_PROVIDER_SITE_OTHER)

## 2023-12-30 DIAGNOSIS — R051 Acute cough: Secondary | ICD-10-CM

## 2023-12-30 MED ORDER — BENZONATATE 200 MG PO CAPS: 200.0000 mg | ORAL_CAPSULE | Freq: Three times a day (TID) | ORAL | 0 refills | Status: AC | PRN

## 2023-12-30 MED ORDER — PREDNISONE 20 MG PO TABS: 40.0000 mg | ORAL_TABLET | Freq: Every day | ORAL | 0 refills | Status: AC

## 2023-12-30 MED ORDER — AMOXICILLIN-POT CLAVULANATE 875-125 MG PO TABS: 1.0000 | ORAL_TABLET | Freq: Two times a day (BID) | ORAL | 0 refills | Status: AC

## 2023-12-30 NOTE — ED Triage Notes (Signed)
 Pt present with c/o a persistent cough. Pt states he had bronchitis 1 wk ago and says he has not gotten better.   Home interventions: mucinex DM, promethazine cough syrup

## 2023-12-30 NOTE — ED Provider Notes (Signed)
 [facility]    CSN: [id number] Arrival date & time: 12/30/23  1532      History   Chief Complaint Chief Complaint  Patient presents with   Cough    HPI Andrew Terrell is a 53 y.o. male  presents for evaluation of URI symptoms for 3 weeks.  Patient was seen in urgent care on 3/17 for 1 week of cough.  Had a negative chest x-ray and was started on Zithromax and Promethazine DM for cough.  States he got somewhat better but not completely resolved.  He continues to have a dry cough with some "discomfort" with coughing.  He denies any fevers, shortness of breath, sore throat, body aches.  No asthma or smoking history.  He has been taking Mucinex.  No other concerns at this time.   Cough   Past Medical History:  Diagnosis Date   Diverticulitis    Thyroid disease     There are no active problems to display for this patient.   History reviewed. No pertinent surgical history.     Home Medications    Prior to Admission medications   Medication Sig Start Date End Date Taking? Authorizing Provider  amoxicillin-clavulanate (AUGMENTIN) 875-125 MG tablet Take 1 tablet by mouth every 12 (twelve) hours. 01/03/24  Yes Radford Pax, NP  benzonatate (TESSALON) 200 MG capsule Take 1 capsule (200 mg total) by mouth 3 (three) times daily as needed. 12/30/23  Yes Radford Pax, NP  predniSONE (DELTASONE) 20 MG tablet Take 2 tablets (40 mg total) by mouth daily with breakfast for 5 days. 12/30/23 01/04/24 Yes Radford Pax, NP  levothyroxine (SYNTHROID, LEVOTHROID) 50 MCG tablet Take 50 mcg by mouth daily.    [provider]  lovastatin (MEVACOR) 20 MG tablet Take 20 mg by mouth at bedtime.    [provider]  promethazine-dextromethorphan (PROMETHAZINE-DM) 6.25-15 MG/5ML syrup Take 5 mLs by mouth 4 (four) times daily as needed for cough. 12/15/23   Radford Pax, NP    Family History History reviewed. No pertinent family history.  Social History Social History    Tobacco Use   Smoking status: Never   Smokeless tobacco: Never  Substance Use Topics   Alcohol use: Not Currently   Drug use: No     Allergies   Patient has no known allergies.   Review of Systems Review of Systems  Respiratory:  Positive for cough.      Physical Exam Triage Vital Signs ED Triage Vitals  Encounter Vitals Group     BP 12/30/23 1554 128/81     Systolic BP Percentile --      Diastolic BP Percentile --      Pulse Rate 12/30/23 1554 80     Resp 12/30/23 1554 17     Temp 12/30/23 1554 98.6 F (37 C)     Temp Source 12/30/23 1554 Oral     SpO2 12/30/23 1554 96 %     Weight --      Height --      Head Circumference --      Peak Flow --      Pain Score 12/30/23 1553 4     Pain Loc --      Pain Education --      Exclude from Growth Chart --    No data found.  Updated Vital Signs BP 128/81 (BP Location: Left Arm)   Pulse 80   Temp 98.6 F (37 C) (Oral)  Resp 17   SpO2 96%   Visual Acuity Right Eye Distance:   Left Eye Distance:   Bilateral Distance:    Right Eye Near:   Left Eye Near:    Bilateral Near:     Physical Exam Vitals and nursing note reviewed.  Constitutional:      General: He is not in acute distress.    Appearance: Normal appearance. He is not ill-appearing or toxic-appearing.  HENT:     Head: Normocephalic and atraumatic.     Right Ear: Tympanic membrane and ear canal normal.     Left Ear: Tympanic membrane and ear canal normal.     Mouth/Throat:     Mouth: Mucous membranes are moist.  Eyes:     Pupils: Pupils are equal, round, and reactive to light.  Cardiovascular:     Rate and Rhythm: Normal rate and regular rhythm.     Heart sounds: Normal heart sounds.  Pulmonary:     Effort: Pulmonary effort is normal.     Breath sounds: Normal breath sounds. No wheezing, rhonchi or rales.  Musculoskeletal:     Cervical back: Normal range of motion and neck supple.  Lymphadenopathy:     Cervical: No cervical adenopathy.   Skin:    General: Skin is warm and dry.  Neurological:     General: No focal deficit present.     Mental Status: He is alert and oriented to person, place, and time.  Psychiatric:        Mood and Affect: Mood normal.        Behavior: Behavior normal.      UC Treatments / Results  Labs (all labs ordered are listed, but only abnormal results are displayed) Labs Reviewed - No data to display  EKG   Radiology No results found.  Procedures Procedures (including critical care time)  Medications Ordered in UC Medications - No data to display  Initial Impression / Assessment and Plan / UC Course  I have reviewed the triage vital signs and the nursing notes.  Pertinent labs & imaging results that were available during my care of the patient were reviewed by me and considered in my medical decision making (see chart for details).     Reviewed exam and symptoms with patient.  No red flags.  Wet read of x-ray shows no changes or obvious consolidation compared to previous x-ray.  Will contact patient with any positive results based on radiology overread.  Will start prednisone and Tessalon.  Provisional prescription for Augmentin provided with instruction not to take unless his symptoms do not improve or worsen once he completes the prednisone.  Discussed rest fluids and PCP follow-up in 2 days for recheck.  ER precautions reviewed and patient verbalized understanding. Final Clinical Impressions(s) / UC Diagnoses   Final diagnoses:  Acute cough     Discharge Instructions      Start prednisone daily for 5 days.  You may take Tessalon 3 times a day as needed for your cough.  A provisional prescription for Augmentin has been provided.  Please do not take unless your symptoms do not improve or worsen over the next 5 days.  Lots of rest and fluids.  Please follow-up with your PCP in 2 days for recheck.  Please go to the ER for any worsening symptoms.  I hope you feel better  soon!     ED Prescriptions     Medication Sig Dispense Auth. Provider   predniSONE (DELTASONE) 20 MG tablet  Take 2 tablets (40 mg total) by mouth daily with breakfast for 5 days. 10 tablet Radford Pax, NP   benzonatate (TESSALON) 200 MG capsule Take 1 capsule (200 mg total) by mouth 3 (three) times daily as needed. 20 capsule Radford Pax, NP   amoxicillin-clavulanate (AUGMENTIN) 875-125 MG tablet Take 1 tablet by mouth every 12 (twelve) hours. 14 tablet Radford Pax, NP      PDMP not reviewed this encounter.   Radford Pax, NP 12/30/23 1700

## 2023-12-30 NOTE — Discharge Instructions (Signed)
 Start prednisone daily for 5 days.  You may take Tessalon 3 times a day as needed for your cough.  A provisional prescription for Augmentin has been provided.  Please do not take unless your symptoms do not improve or worsen over the next 5 days.  Lots of rest and fluids.  Please follow-up with your PCP in 2 days for recheck.  Please go to the ER for any worsening symptoms.  I hope you feel better soon!

## 2024-04-24 ENCOUNTER — Ambulatory Visit
Admission: EM | Admit: 2024-04-24 | Discharge: 2024-04-24 | Disposition: A | Discharge status: Home / Self Care | Attending: Family Medicine | Admitting: Family Medicine

## 2024-04-24 DIAGNOSIS — R220 Localized swelling, mass and lump, head: Secondary | ICD-10-CM | POA: Diagnosis not present

## 2024-04-24 MED ORDER — METHYLPREDNISOLONE 4 MG PO TBPK
ORAL_TABLET | ORAL | 0 refills | Status: AC
Start: 2024-04-25 — End: ?

## 2024-04-24 MED ORDER — DIPHENHYDRAMINE HCL 25 MG PO CAPS
25.0000 mg | ORAL_CAPSULE | Freq: Once | ORAL | Status: AC
  Administered 2024-04-24: 25 mg via ORAL

## 2024-04-24 MED ORDER — METHYLPREDNISOLONE ACETATE 80 MG/ML IJ SUSP
60.0000 mg | Freq: Once | INTRAMUSCULAR | Status: AC
  Administered 2024-04-24: 60 mg via INTRAMUSCULAR

## 2024-04-24 NOTE — ED Provider Notes (Addendum)
 [facility]    CSN: [id number] Arrival date & time: 04/24/24  9170      History   Chief Complaint Chief Complaint  Patient presents with   Facial Swelling    HPI Andrew Terrell is a 53 y.o. male presents for lip swelling.  Patient reports this morning he woke with left lower lip swelling.  States the swelling has stabilized and not worsened.  Reports it is tender if he presses on it but he denies any lumps bumps, rashes, or injuries.  Denies any swelling of his face, throat, tongue.  No difficulty swallowing or breathing.  Denies new contacts including soaps, foods, lotions, etc.  No new medications.  He did some rubbing alcohol to the area which he states it improved.  No other concerns at this time.  HPI  Past Medical History:  Diagnosis Date   Diverticulitis    Thyroid disease     There are no active problems to display for this patient.   History reviewed. No pertinent surgical history.     Home Medications    Prior to Admission medications   Medication Sig Start Date End Date Taking? Authorizing Provider  methylPREDNISolone  (MEDROL  DOSEPAK) 4 MG TBPK tablet Take as prescribed on package 04/25/24  Yes Lyzette Reinhardt, Jodi R, NP  amoxicillin -clavulanate (AUGMENTIN ) 875-125 MG tablet Take 1 tablet by mouth every 12 (twelve) hours. 01/03/24   Deaira Leckey, Jodi R, NP  benzonatate  (TESSALON ) 200 MG capsule Take 1 capsule (200 mg total) by mouth 3 (three) times daily as needed. 12/30/23   Lynwood Kubisiak, Jodi R, NP  levothyroxine (SYNTHROID, LEVOTHROID) 50 MCG tablet Take 50 mcg by mouth daily.    [provider]  lovastatin (MEVACOR) 20 MG tablet Take 20 mg by mouth at bedtime.    [provider]  promethazine -dextromethorphan (PROMETHAZINE -DM) 6.25-15 MG/5ML syrup Take 5 mLs by mouth 4 (four) times daily as needed for cough. 12/15/23   Loreda Myla SAUNDERS, NP    Family History History reviewed. No pertinent family history.  Social History Social History   Tobacco Use    Smoking status: Never   Smokeless tobacco: Never  Substance Use Topics   Alcohol use: Not Currently   Drug use: No     Allergies   Patient has no known allergies.   Review of Systems Review of Systems  HENT:         Lower lip swelling     Physical Exam Triage Vital Signs ED Triage Vitals  Encounter Vitals Group     BP 04/24/24 0855 129/88     Girls Systolic BP Percentile --      Girls Diastolic BP Percentile --      Boys Systolic BP Percentile --      Boys Diastolic BP Percentile --      Pulse Rate 04/24/24 0855 73     Resp 04/24/24 0855 18     Temp 04/24/24 0855 98.2 F (36.8 C)     Temp Source 04/24/24 0855 Oral     SpO2 04/24/24 0855 96 %     Weight --      Height --      Head Circumference --      Peak Flow --      Pain Score 04/24/24 0854 2     Pain Loc --      Pain Education --      Exclude from Growth Chart --    No data found.  Updated Vital Signs BP  129/88 (BP Location: Left Arm)   Pulse 73   Temp 98.2 F (36.8 C) (Oral)   Resp 18   SpO2 96%   Visual Acuity Right Eye Distance:   Left Eye Distance:   Bilateral Distance:    Right Eye Near:   Left Eye Near:    Bilateral Near:     Physical Exam Vitals and nursing note reviewed.  Constitutional:      General: He is not in acute distress.    Appearance: Normal appearance. He is not ill-appearing.  HENT:     Head: Normocephalic and atraumatic.     Mouth/Throat:     Mouth: Mucous membranes are moist. No injury, lacerations or oral lesions.     Tongue: Tongue does not deviate from midline.     Pharynx: Oropharynx is clear. Uvula midline. No pharyngeal swelling or uvula swelling.      Comments: Mild swelling of the left lower lip.  There is no erythema, bruising, warmth, vesicles, lesions.  There is no facial swelling.  Airway is patent with midline uvula.  Patient is speaking in complete sentences without difficulty.  See photo Eyes:     Extraocular Movements: Extraocular movements intact.      Conjunctiva/sclera: Conjunctivae normal.     Pupils: Pupils are equal, round, and reactive to light.  Cardiovascular:     Rate and Rhythm: Normal rate and regular rhythm.     Heart sounds: Normal heart sounds.  Pulmonary:     Effort: Pulmonary effort is normal.     Breath sounds: Normal breath sounds.  Skin:    General: Skin is warm and dry.  Neurological:     General: No focal deficit present.     Mental Status: He is alert and oriented to person, place, and time.  Psychiatric:        Mood and Affect: Mood normal.        Behavior: Behavior normal.      UC Treatments / Results  Labs (all labs ordered are listed, but only abnormal results are displayed) Labs Reviewed - No data to display  EKG   Radiology No results found.  Procedures Procedures (including critical care time)  Medications Ordered in UC Medications  methylPREDNISolone  acetate (DEPO-MEDROL ) injection 60 mg (60 mg Intramuscular Given 04/24/24 0915)  diphenhydrAMINE  (BENADRYL ) capsule 25 mg (25 mg Oral Given 04/24/24 0915)    Initial Impression / Assessment and Plan / UC Course  I have reviewed the triage vital signs and the nursing notes.  Pertinent labs & imaging results that were available during my care of the patient were reviewed by me and considered in my medical decision making (see chart for details).     Reviewed exam and symptoms with patient.  He is hemodynamically stable with no respiratory distress/symptoms.  Mild left lower lip swelling that patient states is stabilized since he noticed it this morning.  He was given IM Depo-Medrol  and oral Benadryl  in clinic.  He was monitored for 30 minutes after injection with no reaction noted and tolerated well.  He reports improvement in his symptoms.  He continues to remain free of any respiratory distress and again denies any throat, tongue swelling or any difficulty breathing or swallowing.  Patient is comfortable monitoring his symptoms at home but  I did review strict ER precautions with him.  He may continue OTC Benadryl  every 8 hours as needed.  Will do Medrol  Dosepak to start tomorrow, 7/27.  Advised PCP follow-up 2 days for  recheck.  ER precautions reviewed. Final Clinical Impressions(s) / UC Diagnoses   Final diagnoses:  Lip swelling     Discharge Instructions      You were given Benadryl  and a steroid injection in the clinic for your lip swelling.  Continue to monitor your symptoms and you may continue over-the-counter Benadryl  every 8 hours as needed.  A prescription for a steroid Dosepak has been provided for you to start tomorrow, 7/27.  Please closely monitor your symptoms and if you feel like they are worsening and/or you develop any new symptoms such as throat swelling, tongue swelling, facial swelling, difficulty breathing or swallowing please call 911/go to the emergency room ASAP.  Follow-up with your PCP in 2 days for recheck.  Hope you feel better soon.     ED Prescriptions     Medication Sig Dispense Auth. Provider   methylPREDNISolone  (MEDROL  DOSEPAK) 4 MG TBPK tablet Take as prescribed on package 21 tablet Rayfield Beem, Jodi R, NP      PDMP not reviewed this encounter.   Loreda Myla SAUNDERS, NP 04/24/24 0944    Loreda Myla SAUNDERS, NP 04/24/24 [phone]

## 2024-04-24 NOTE — ED Notes (Signed)
 Patient alert and stable.

## 2024-04-24 NOTE — Discharge Instructions (Addendum)
 You were given Benadryl  and a steroid injection in the clinic for your lip swelling.  Continue to monitor your symptoms and you may continue over-the-counter Benadryl  every 8 hours as needed.  A prescription for a steroid Dosepak has been provided for you to start tomorrow, 7/27.  Please closely monitor your symptoms and if you feel like they are worsening and/or you develop any new symptoms such as throat swelling, tongue swelling, facial swelling, difficulty breathing or swallowing please call 911/go to the emergency room ASAP.  Follow-up with your PCP in 2 days for recheck.  Hope you feel better soon.

## 2024-04-24 NOTE — ED Triage Notes (Addendum)
 Pt reports having lip swelling to bottom lip on the left side. Patient denies having SHOB, speech is clear, and denies changing any items of use at home. Patient does not display physical signs/ symptoms of respiratory distress at this time. No other swelling noted.   Started: today  Home interventions: cleansed with rubbing  alcohol

## 2024-05-07 ENCOUNTER — Other Ambulatory Visit: Payer: Self-pay

## 2024-05-07 ENCOUNTER — Emergency Department (HOSPITAL_BASED_OUTPATIENT_CLINIC_OR_DEPARTMENT_OTHER)
Admission: EM | Admit: 2024-05-07 | Discharge: 2024-05-07 | Disposition: A | Discharge status: Home / Self Care | Source: Ambulatory Visit | Attending: Emergency Medicine | Admitting: Emergency Medicine

## 2024-05-07 ENCOUNTER — Encounter (HOSPITAL_BASED_OUTPATIENT_CLINIC_OR_DEPARTMENT_OTHER): Payer: Self-pay | Admitting: Emergency Medicine

## 2024-05-07 DIAGNOSIS — R739 Hyperglycemia, unspecified: Secondary | ICD-10-CM | POA: Insufficient documentation

## 2024-05-07 LAB — I-STAT VENOUS BLOOD GAS, ED
Acid-Base Excess: 1 mmol/L (ref 0.0–2.0)
Bicarbonate: 27.6 mmol/L (ref 20.0–28.0)
Calcium, Ion: 1.31 mmol/L (ref 1.15–1.40)
HCT: 49 % (ref 39.0–52.0)
Hemoglobin: 16.7 g/dL (ref 13.0–17.0)
O2 Saturation: 30 %
Patient temperature: 98.6
Potassium: 4.2 mmol/L (ref 3.5–5.1)
Sodium: 138 mmol/L (ref 135–145)
TCO2: 29 mmol/L (ref 22–32)
pCO2, Ven: 47.8 mmHg (ref 44–60)
pH, Ven: 7.369 (ref 7.25–7.43)
pO2, Ven: 20 mmHg — CL (ref 32–45)

## 2024-05-07 LAB — CBC
HCT: 46.9 % (ref 39.0–52.0)
Hemoglobin: 15.3 g/dL (ref 13.0–17.0)
MCH: 28.5 pg (ref 26.0–34.0)
MCHC: 32.6 g/dL (ref 30.0–36.0)
MCV: 87.5 fL (ref 80.0–100.0)
Platelets: 182 K/uL (ref 150–400)
RBC: 5.36 MIL/uL (ref 4.22–5.81)
RDW: 13.3 % (ref 11.5–15.5)
WBC: 8.4 K/uL (ref 4.0–10.5)
nRBC: 0 % (ref 0.0–0.2)

## 2024-05-07 LAB — BASIC METABOLIC PANEL WITH GFR
Anion gap: 11 (ref 5–15)
BUN: 16 mg/dL (ref 6–20)
CO2: 23 mmol/L (ref 22–32)
Calcium: 9.7 mg/dL (ref 8.9–10.3)
Chloride: 101 mmol/L (ref 98–111)
Creatinine, Ser: 1.18 mg/dL (ref 0.61–1.24)
GFR, Estimated: >60 mL/min (ref >60)
Glucose, Bld: 359 mg/dL — ABNORMAL HIGH (ref 70–99)
Potassium: 4.2 mmol/L (ref 3.5–5.1)
Sodium: 135 mmol/L (ref 135–145)

## 2024-05-07 LAB — URINALYSIS, ROUTINE W REFLEX MICROSCOPIC
Bilirubin Urine: NEGATIVE
Glucose, UA: >=500 mg/dL — AB
Hgb urine dipstick: NEGATIVE
Ketones, ur: NEGATIVE mg/dL
Leukocytes,Ua: NEGATIVE
Nitrite: NEGATIVE
Protein, ur: NEGATIVE mg/dL
Specific Gravity, Urine: 1.02 (ref 1.005–1.030)
pH: 5.5 (ref 5.0–8.0)

## 2024-05-07 LAB — CBG MONITORING, ED
Glucose-Capillary: 364 mg/dL — ABNORMAL HIGH (ref 70–99)
Glucose-Capillary: 245 mg/dL — ABNORMAL HIGH (ref 70–99)

## 2024-05-07 LAB — URINALYSIS, MICROSCOPIC (REFLEX)
Bacteria, UA: NONE SEEN
Squamous Epithelial / HPF: NONE SEEN /HPF (ref 0–5)

## 2024-05-07 LAB — BETA-HYDROXYBUTYRIC ACID: Beta-Hydroxybutyric Acid: 0.12 mmol/L (ref 0.05–0.27)

## 2024-05-07 MED ORDER — SODIUM CHLORIDE 0.9 % IV BOLUS
1000.0000 mL | Freq: Once | INTRAVENOUS | Status: AC
  Administered 2024-05-07: 1000 mL via INTRAVENOUS

## 2024-05-07 NOTE — Discharge Instructions (Addendum)
 Today you were seen for hyperglycemia.  Your A1c is still pending.  Please follow-up with your primary care for further evaluation workup.  Please return to the ED if you have worsening symptoms.  Thank you for letting us  treat you today. After reviewing your labs, I feel you are safe to go home. Please follow up with your PCP in the next several days and provide them with your records from this visit. Return to the Emergency Room if pain becomes severe or symptoms worsen.

## 2024-05-07 NOTE — ED Notes (Signed)

## 2024-05-07 NOTE — ED Notes (Signed)
Called lab for addon.  

## 2024-05-07 NOTE — ED Triage Notes (Signed)
 Pt sent from PCP- reports being told he had elevated blood sugar.   Also c/o BL calf cramping since yesterday.   Denies hx of diabetes. Reports finishing prednisone  prescription last Friday.   Reports he did not have breakfast, but drank high sugar drink last night.

## 2024-05-07 NOTE — ED Provider Notes (Signed)
 [place] [facility] Provider Note   CSN: [id number] Arrival date & time: 05/07/24  1522     Patient presents with: Hyperglycemia   Andrew Terrell is a 53 y.o. male past medical history significant for diverticulitis and thyroid disease presents today for hyperglycemia after being seen at his primary care physician.  Patient reports he previously was prediabetic but improved his A1c with dietary changes.  Patient also reports finishing a prednisone  prescription last Friday.  Patient reports polydipsia and polyuria.  Patient denies fever, chills, nausea, vomiting, chest pain, shortness of breath, or abdominal pain.    Hyperglycemia Associated symptoms: increased thirst and polyuria        Prior to Admission medications   Medication Sig Start Date End Date Taking? Authorizing Provider  amoxicillin -clavulanate (AUGMENTIN ) 875-125 MG tablet Take 1 tablet by mouth every 12 (twelve) hours. 01/03/24   Mayer, Jodi R, NP  benzonatate  (TESSALON ) 200 MG capsule Take 1 capsule (200 mg total) by mouth 3 (three) times daily as needed. 12/30/23   Mayer, Jodi R, NP  levothyroxine (SYNTHROID, LEVOTHROID) 50 MCG tablet Take 50 mcg by mouth daily.    [provider]  lovastatin (MEVACOR) 20 MG tablet Take 20 mg by mouth at bedtime.    [provider]  methylPREDNISolone  (MEDROL  DOSEPAK) 4 MG TBPK tablet Take as prescribed on package 04/25/24   Mayer, Jodi R, NP  promethazine -dextromethorphan (PROMETHAZINE -DM) 6.25-15 MG/5ML syrup Take 5 mLs by mouth 4 (four) times daily as needed for cough. 12/15/23   Mayer, Jodi R, NP    Allergies: Patient has no known allergies.    Review of Systems  Endocrine: Positive for polydipsia and polyuria.    Updated Vital Signs BP (!) 126/96   Pulse 85   Temp 98.6 F (37 C)   Resp 16   Ht 6' 3 (1.905 m)   Wt 93 kg   SpO2 98%   BMI 25.62 kg/m   Physical Exam Vitals and nursing note reviewed.  Constitutional:       General: He is not in acute distress.    Appearance: Normal appearance. He is well-developed. He is not ill-appearing.  HENT:     Head: Normocephalic and atraumatic.     Nose: Nose normal.     Mouth/Throat:     Mouth: Mucous membranes are moist.     Pharynx: Oropharynx is clear.  Eyes:     Extraocular Movements: Extraocular movements intact.     Conjunctiva/sclera: Conjunctivae normal.  Cardiovascular:     Rate and Rhythm: Normal rate and regular rhythm.     Pulses: Normal pulses.     Heart sounds: Normal heart sounds. No murmur heard. Pulmonary:     Effort: Pulmonary effort is normal. No respiratory distress.     Breath sounds: Normal breath sounds.  Abdominal:     Palpations: Abdomen is soft.     Tenderness: There is no abdominal tenderness.  Musculoskeletal:        General: No swelling.     Cervical back: Neck supple.  Skin:    General: Skin is warm and dry.     Capillary Refill: Capillary refill takes less than 2 seconds.  Neurological:     General: No focal deficit present.     Mental Status: He is alert and oriented to person, place, and time.  Psychiatric:        Mood and Affect: Mood normal.     (all labs ordered are listed,  but only abnormal results are displayed) Labs Reviewed  BASIC METABOLIC PANEL WITH GFR - Abnormal; Notable for the following components:      Result Value   Glucose, Bld 359 (*)    All other components within normal limits  URINALYSIS, ROUTINE W REFLEX MICROSCOPIC - Abnormal; Notable for the following components:   Glucose, UA >=500 (*)    All other components within normal limits  CBG MONITORING, ED - Abnormal; Notable for the following components:   Glucose-Capillary 364 (*)    All other components within normal limits  I-STAT VENOUS BLOOD GAS, ED - Abnormal; Notable for the following components:   pO2, Ven 20 (*)    All other components within normal limits  CBG MONITORING, ED - Abnormal; Notable for the following components:    Glucose-Capillary 245 (*)    All other components within normal limits  CBC  BETA-HYDROXYBUTYRIC ACID  URINALYSIS, MICROSCOPIC (REFLEX)  HEMOGLOBIN A1C    EKG: None  Radiology: No results found.   Procedures   Medications Ordered in the ED  sodium chloride  0.9 % bolus 1,000 mL (0 mLs Intravenous Stopped 05/07/24 1843)                                    Medical Decision Making Amount and/or Complexity of Data Reviewed Labs: ordered.   This patient presents to the ED for concern of hyperglycemia differential diagnosis includes DKA, HHS, hyperglycemia  Additional history obtained Additional history obtained from Electronic Medical Record External records from outside source obtained and reviewed including Care Everywhere   Lab Tests: I Ordered, and personally interpreted labs.  The pertinent results include: Hyperglycemia at 364, BMP otherwise unremarkable, CBC WNL, UA with greater than 500 glucose, normal pH, BMP unremarkable, beta hydroxy 0.12, glucose after IVF 245   Medicines ordered and prescription drug management:  I ordered medication including IVF    I have reviewed the patients home medicines and have made adjustments as needed   Problem List / ED Course:  Considered for admission or further workup however patient's vital signs, physical exam, and labs are reassuring.  Patient has no signs or symptoms concerning for DKA or HHS.  Patient's glucose has come down to 245 after IVF.  Patient has A1c pending and advised to follow-up with his primary care for further evaluation or workup.  Patient given return precautions.  I feel patient safe for discharge at this time.     Final diagnoses:  Hyperglycemia    ED Discharge Orders     None          Francis Ileana SAILOR, PA-C 05/07/24 1906    Emil Share, DO 05/07/24 [phone]

## 2024-05-10 LAB — HEMOGLOBIN A1C
Hgb A1c MFr Bld: 15 % — ABNORMAL HIGH (ref 4.8–5.6)
Mean Plasma Glucose: 384 mg/dL
